# Patient Record
Sex: Male | Born: 1980 | Hispanic: Yes | Marital: Single | Smoking: Never smoker
Health system: Southern US, Community
[De-identification: ages and names within clinical notes are randomized; demographics above are authoritative.]

---

## 2012-03-01 ENCOUNTER — Emergency Department: Payer: Self-pay | Admitting: Emergency Medicine

## 2012-03-10 ENCOUNTER — Ambulatory Visit: Payer: Self-pay | Admitting: Orthopedic Surgery

## 2014-09-19 ENCOUNTER — Emergency Department: Payer: Self-pay | Admitting: Emergency Medicine

## 2014-09-29 ENCOUNTER — Ambulatory Visit: Payer: Self-pay | Admitting: Orthopedic Surgery

## 2014-09-29 LAB — BASIC METABOLIC PANEL
ANION GAP: 3 — AB (ref 7–16)
BUN: 17 mg/dL (ref 7–18)
CALCIUM: 9 mg/dL (ref 8.5–10.1)
Chloride: 108 mmol/L — ABNORMAL HIGH (ref 98–107)
Co2: 32 mmol/L (ref 21–32)
Creatinine: 0.89 mg/dL (ref 0.60–1.30)
EGFR (Non-African Amer.): >60
Glucose: 111 mg/dL — ABNORMAL HIGH (ref 65–99)
Osmolality: 287 (ref 275–301)
Potassium: 4.1 mmol/L (ref 3.5–5.1)
Sodium: 143 mmol/L (ref 136–145)

## 2014-09-29 LAB — URINALYSIS, COMPLETE
BACTERIA: NONE SEEN
Bilirubin,UR: NEGATIVE
Blood: NEGATIVE
Glucose,UR: NEGATIVE mg/dL (ref 0–75)
Ketone: NEGATIVE
Leukocyte Esterase: NEGATIVE
NITRITE: NEGATIVE
PH: 5 (ref 4.5–8.0)
Protein: NEGATIVE
Specific Gravity: 1.019 (ref 1.003–1.030)
Squamous Epithelial: NONE SEEN

## 2014-09-29 LAB — CBC
HCT: 46.2 % (ref 40.0–52.0)
HGB: 15.5 g/dL (ref 13.0–18.0)
MCH: 31.1 pg (ref 26.0–34.0)
MCHC: 33.5 g/dL (ref 32.0–36.0)
MCV: 93 fL (ref 80–100)
Platelet: 157 10*3/uL (ref 150–440)
RBC: 4.96 10*6/uL (ref 4.40–5.90)
RDW: 13.4 % (ref 11.5–14.5)
WBC: 5 10*3/uL (ref 3.8–10.6)

## 2014-09-29 LAB — PROTIME-INR
INR: 1
PROTHROMBIN TIME: 13.2 s (ref 11.5–14.7)

## 2014-09-29 LAB — APTT: Activated PTT: 30.7 secs (ref 23.6–35.9)

## 2015-03-18 NOTE — Op Note (Signed)
PATIENT NAME:  Jeffrey Montgomery, Jeffrey Montgomery MR#:  [id number] DATE OF BIRTH:  Mar 16, 1981  DATE OF PROCEDURE:  09/29/2014  PREOPERATIVE DIAGNOSIS: Left fibular shaft fracture with associated syndesmotic injury of the left ankle.   POSTOPERATIVE DIAGNOSIS: Left fibular shaft fracture with associated syndesmotic injury of the left ankle.   PROCEDURE: Open reduction and internal fixation of left fibular shaft fracture and placement of Arthrex ankle tightrope for stabilization of the syndesmotic injury.   SURGEON: Juanell Fairly, MD   ANESTHESIA: General.   SPECIMENS: None.   ESTIMATED BLOOD LOSS: Minimal.   COMPLICATIONS: None.   IMPLANTS: Synthes one-third tubular 8 hole small fragment plate  and Arthrex ankle tightrope.   INDICATIONS FOR PROCEDURE: The patient is a 34 year old male who injured his left ankle while playing soccer. X-rays revealed a comminuted fibular shaft fracture with associated widening of the syndesmosis. Given the extent of the patient's injury and is high functioning at baseline, the recommendation was for the patient to proceed with surgical fixation of both injuries. I reviewed the risks and benefits of surgery with him including infection, wound healing difficulty, hardware failure, nonunion, malunion, nerve or blood vessel injury, persistent ankle pain or the development of posttraumatic arthritis in the left ankle as well as the need for further surgery including removal of the hardware. Medical risks include, but are not limited to DVT and pulmonary embolism, cartilage infection, stroke, pneumonia, respiratory failure and death. The patient understood these risks and wished to proceed.   PROCEDURE NOTE: The patient was brought to the operating room. He was placed supine on the operative table and underwent general endotracheal intubation. A bump was placed under his left hip. He was prepped and draped in a sterile fashion. A timeout was performed to verify the patient's name,  date of birth, medical record number, correct site of surgery and correct procedure to be performed. It was also used to verify that patient had received antibiotics, all proper instruments, implants, and radiographic studies were available in the room. Once all in attendance were in agreement, the case began.   A lateral incision was made centered over the fibula at the level of the fracture. The deep fascia was carefully incised, taking care to avoid injury to the superficial peroneal nerve. The fracture was then easily palpated and overlying muscle tissue was removed using an elevator. A curette was used to remove fracture hematoma. The fracture was manually reduced using fracture reduction forceps. An 8 hole, one-third tubular plate was then placed over the fracture. The fracture was too comminuted to place a lag screw but 3 distal bicortical screws as well as 3 proximal screws were placed. This allowed for near-anatomic reduction of the comminuted fracture. Once the fibula was stabilized, a secondary incision was made approximately 1.5 to 2 cm above the ankle joint line. Arthrex ankle tightrope was used. The drill included with the tightrope set was then used to drill across 4 cortices of the fibula and tibia at approximately 30 degrees of anterior angulation parallel to the ankle joint line. A single stainless steel tightrope was then placed. Once the tightrope button was flipped on the medial cortex of the tibia, the tightrope was tightened with the ankle in a neutral position. The sutures of the tightrope were then cut with an arthroscopic knot cutter. The wounds were then copiously irrigated. The wounds were then closed with 2-0 Vicryl and the skin approximated with staples. An AO splint was made for the left ankle. The patient was  then awakened and brought to the PACU in stable condition. I was scrubbed and present for the entire case, and all sharp and instrument counts were correct at the conclusion of  the case. The patient's family was not present postoperatively to speak with in the consultation room, so I called his family member and let them know the case had gone without complication and the patient was stable in the recovery room.    ____________________________ Kathreen DevoidKevin L. Lillieanna Tuohy, MD klk:TT D: 10/03/2014 21:39:38 ET T: 10/03/2014 22:01:37 ET JOB#: [id number]  cc: Kathreen DevoidKevin L. Faatima Tench, MD, <Dictator> Kathreen DevoidKEVIN L Blessin Kanno MD ELECTRONICALLY SIGNED 10/12/2014 8:07

## 2015-03-19 NOTE — Op Note (Signed)
PATIENT NAME:  Jeffrey Montgomery, Jeffrey Montgomery MR#:  [id number] DATE OF BIRTH:  1981-01-04  DATE OF PROCEDURE:  03/10/2012  PREOPERATIVE DIAGNOSIS: Displaced left distal radius fracture.   POSTOPERATIVE DIAGNOSIS: Displaced left distal radius fracture.  PROCEDURE: Open reduction internal fixation left distal radius.   ANESTHESIA: General.   SURGEON: Leitha SchullerMichael Montgomery. Lanee Chain, MD    DESCRIPTION OF PROCEDURE: The patient was brought to the operating room and after adequate anesthesia was obtained, the arm was prepped and draped in the usual sterile fashion with a tourniquet applied to the upper arm. After patient identification and time-out procedures were completed, fingertrap traction was applied with 10 pounds of traction and the tourniquet raised. Incision was made over the FCR tendon, the incision down to the tendon and through the tendon sheath. The tendon was retracted to the ulnar side, radial artery to the radial side, and the deep fascia incised. The pronator was elevated off the distal radius and the fracture site exposed. There was dorsal comminution and distal dorsal pressure did not really reduce so the technique of applying the plate and then reducing the plate to the shaft was performed in a short standard width DVR plate and appropriate position of the distal fragment. Multiple smooth pegs were placed without penetration into the joint with fluoroscopic views being used with the mini C-arm to assess this. After placing all the smooth pegs in the distal fragment, the plate was reduced to the shaft and three 10 mm cortical screws were inserted. There appeared to be stable fixation. Traction was released and on stress views there was no displacement with any range of motion. There appeared to be restoration of length, radial tilt, and some volar tilt. The wound was thoroughly irrigated and the tourniquet let down. Hemostasis was checked with electrocautery. The wound was closed with 2-0 Vicryl subcutaneously and  4-0 nylon for the skin. Xeroform, 4 x 4's Webril, volar splint, and Ace wrap were applied.   CONDITION: To recovery room stable.   ESTIMATED BLOOD LOSS: Minimal.    TOURNIQUET TIME: 31 minutes at 250 mmHg.   ____________________________ Leitha SchullerMichael Montgomery. Devonne Lalani, MD mjm:drc D: 03/10/2012 20:52:15 ET T: 03/11/2012 10:22:55 ET JOB#: [id number]  cc: Leitha SchullerMichael Montgomery. Haja Crego, MD, <Dictator> Leitha SchullerMICHAEL Montgomery Pauline Trainer MD ELECTRONICALLY SIGNED 03/12/2012 0:14

## 2016-05-23 ENCOUNTER — Encounter (HOSPITAL_COMMUNITY): Payer: Self-pay

## 2016-05-23 ENCOUNTER — Emergency Department (HOSPITAL_COMMUNITY)
Admission: EM | Admit: 2016-05-23 | Discharge: 2016-05-23 | Disposition: A | Discharge status: Home / Self Care | Payer: No Typology Code available for payment source | Attending: Emergency Medicine | Admitting: Emergency Medicine

## 2016-05-23 ENCOUNTER — Emergency Department (HOSPITAL_COMMUNITY): Payer: No Typology Code available for payment source

## 2016-05-23 DIAGNOSIS — W228XXA Striking against or struck by other objects, initial encounter: Secondary | ICD-10-CM | POA: Insufficient documentation

## 2016-05-23 DIAGNOSIS — Y939 Activity, unspecified: Secondary | ICD-10-CM | POA: Diagnosis not present

## 2016-05-23 DIAGNOSIS — Y999 Unspecified external cause status: Secondary | ICD-10-CM | POA: Insufficient documentation

## 2016-05-23 DIAGNOSIS — S82142A Displaced bicondylar fracture of left tibia, initial encounter for closed fracture: Secondary | ICD-10-CM | POA: Insufficient documentation

## 2016-05-23 DIAGNOSIS — Y929 Unspecified place or not applicable: Secondary | ICD-10-CM | POA: Diagnosis not present

## 2016-05-23 DIAGNOSIS — M25562 Pain in left knee: Secondary | ICD-10-CM | POA: Diagnosis present

## 2016-05-23 MED ORDER — OXYCODONE-ACETAMINOPHEN 5-325 MG PO TABS
1.0000 | ORAL_TABLET | Freq: Once | ORAL | Status: AC
  Administered 2016-05-23: 1 via ORAL
  Filled 2016-05-23: qty 1

## 2016-05-23 MED ORDER — OXYCODONE-ACETAMINOPHEN 5-325 MG PO TABS: 1.0000 | ORAL_TABLET | Freq: Three times a day (TID) | ORAL | Status: AC | PRN
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
## 2016-05-23 NOTE — ED Notes (Signed)
PT DISCHARGED. INSTRUCTIONS AND PRESCRIPTION GIVEN. AAOX4. PT IN NO APPARENT DISTRESS. THE OPPORTUNITY TO ASK QUESTIONS WAS PROVIDED. 
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
## 2017-05-09 ENCOUNTER — Telehealth (INDEPENDENT_AMBULATORY_CARE_PROVIDER_SITE_OTHER): Payer: Self-pay | Admitting: Orthopaedic Surgery

## 2017-05-09 NOTE — Telephone Encounter (Signed)
[facility] RECORDS & BILL[facility] 05/23/2016-[facility] [phone]
